# Patient Record
Sex: Female | Born: 1985 | Race: Asian | Hispanic: No | Marital: Married | Smoking: Never smoker
Health system: Southern US, Community
[De-identification: ages and names within clinical notes are randomized; demographics above are authoritative.]

## PROBLEM LIST (undated history)

## (undated) DIAGNOSIS — J302 Other seasonal allergic rhinitis: Secondary | ICD-10-CM

## (undated) DIAGNOSIS — J309 Allergic rhinitis, unspecified: Secondary | ICD-10-CM

## (undated) HISTORY — DX: Allergic rhinitis, unspecified: J30.9

## (undated) HISTORY — PX: WISDOM TOOTH EXTRACTION: SHX21

---

## 2015-06-20 ENCOUNTER — Ambulatory Visit
Admission: EM | Admit: 2015-06-20 | Discharge: 2015-06-20 | Disposition: A | Discharge status: Home / Self Care | Payer: BLUE CROSS/BLUE SHIELD | Attending: Family Medicine | Admitting: Family Medicine

## 2015-06-20 DIAGNOSIS — R05 Cough: Secondary | ICD-10-CM

## 2015-06-20 DIAGNOSIS — J0410 Acute tracheitis without obstruction: Secondary | ICD-10-CM | POA: Diagnosis not present

## 2015-06-20 DIAGNOSIS — R059 Cough, unspecified: Secondary | ICD-10-CM

## 2015-06-20 MED ORDER — FEXOFENADINE-PSEUDOEPHED ER 180-240 MG PO TB24: 1.0000 | ORAL_TABLET | Freq: Every day | ORAL | Status: DC

## 2015-06-20 MED ORDER — AZITHROMYCIN 250 MG PO TABS: ORAL_TABLET | ORAL | Status: AC

## 2015-06-20 MED ORDER — BENZONATATE 200 MG PO CAPS: 200.0000 mg | ORAL_CAPSULE | Freq: Three times a day (TID) | ORAL | Status: DC | PRN

## 2015-06-20 NOTE — Discharge Instructions (Signed)
Cough, Adult °A cough helps to clear your throat and lungs. A cough may last only 2-3 weeks (acute), or it may last longer than 8 weeks (chronic). Many different things can cause a cough. A cough may be a sign of an illness or another medical condition. °HOME CARE °· Pay attention to any changes in your cough. °· Take medicines only as told by your doctor. °· If you were prescribed an antibiotic medicine, take it as told by your doctor. Do not stop taking it even if you start to feel better. °· Talk with your doctor before you try using a cough medicine. °· Drink enough fluid to keep your pee (urine) clear or pale yellow. °· If the air is dry, use a cold steam vaporizer or humidifier in your home. °· Stay away from things that make you cough at work or at home. °· If your cough is worse at night, try using extra pillows to raise your head up higher while you sleep. °· Do not smoke, and try not to be around smoke. If you need help quitting, ask your doctor. °· Do not have caffeine. °· Do not drink alcohol. °· Rest as needed. °GET HELP IF: °· You have new problems (symptoms). °· You cough up yellow fluid (pus). °· Your cough does not get better after 2-3 weeks, or your cough gets worse. °· Medicine does not help your cough and you are not sleeping well. °· You have pain that gets worse or pain that is not helped with medicine. °· You have a fever. °· You are losing weight and you do not know why. °· You have night sweats. °GET HELP RIGHT AWAY IF: °· You cough up blood. °· You have trouble breathing. °· Your heartbeat is very fast. °  °This information is not intended to replace advice given to you by your health care provider. Make sure you discuss any questions you have with your health care provider. °  °Document Released: 01/24/2011 Document Revised: 02/01/2015 Document Reviewed: 07/20/2014 °Elsevier Interactive Patient Education ©2016 [employer] ° °Laryngitis °Laryngitis is swelling (inflammation) of your vocal  cords. This causes hoarseness, coughing, loss of voice, sore throat, or a dry throat. When your vocal cords are inflamed, your voice sounds different. °Laryngitis can be temporary (acute) or long-term (chronic). Most cases of acute laryngitis improve with time. Chronic laryngitis is laryngitis that lasts for more than three weeks. °HOME CARE °· Drink enough fluid to keep your pee (urine) clear or pale yellow. °· Breathe in moist air. Use a humidifier if you live in a dry climate. °· Take medicines only as told by your doctor. °· Do not smoke cigarettes or electronic cigarettes. If you need help quitting, ask your doctor. °· Talk as little as possible. Also avoid whispering, which can cause vocal strain. °· Write instead of talking. Do this until your voice is back to normal. °GET HELP IF: °· You have a fever. °· Your pain is worse. °· You have trouble swallowing. °GET HELP RIGHT AWAY IF: °· You cough up blood. °· You have trouble breathing. °  °This information is not intended to replace advice given to you by your health care provider. Make sure you discuss any questions you have with your health care provider. °  °Document Released: 05/02/2011 Document Revised: 06/03/2014 Document Reviewed: 10/26/2013 °Elsevier Interactive Patient Education ©2016 Elsevier Inc. ° °

## 2015-06-20 NOTE — ED Notes (Signed)
States has had a cough x 1 week and an "itchy throat". Also requests Rx for BCP

## 2015-06-20 NOTE — ED Provider Notes (Signed)
CSN: [id number]     Arrival date & time 06/20/15  0818 History   First MD Initiated Contact with Patient 06/20/15 0840    Nurses notes were reviewed. Chief Complaint  Patient presents with  . Cough    patient is here because of cough she reports having a cough now for about a month earlier and then it went away now the cough is persistent issue over throat started back up again. She states it is tumor throat going on for about a week she reports taking some over-the-counter medication but no symptoms other than the cough and itchiness of her throat. When asked if she had a sore throat she denies asked having a sore throat now.     (Consider location/radiation/quality/duration/timing/severity/associated sxs/prior Treatment) Patient is a 30 y.o. female presenting with cough. The history is provided by the patient. No language interpreter was used.  Cough Cough characteristics:  Non-productive Severity:  Moderate Duration:  1 week Timing:  Constant Progression:  Unable to specify Chronicity:  New Smoker: yes   Context: upper respiratory infection   Context: not animal exposure, not exposure to allergens, not fumes, not occupational exposure, not sick contacts and not smoke exposure   Relieved by:  Nothing Worsened by:  Nothing tried Ineffective treatments:  Cough suppressants Associated symptoms: sore throat   Associated symptoms: no chest pain, no chills, no diaphoresis, no ear fullness, no ear pain, no fever, no headaches, no myalgias, no rash, no rhinorrhea, no shortness of breath, no sinus congestion, no weight loss and no wheezing     History reviewed. No pertinent past medical history. History reviewed. No pertinent past surgical history. History reviewed. No pertinent family history. Social History  Substance Use Topics  . Smoking status: Never Smoker   . Smokeless tobacco: None  . Alcohol Use: No   OB History    No data available     Review of Systems  Constitutional:  Negative for fever, chills, weight loss and diaphoresis.  HENT: Positive for sore throat. Negative for ear pain and rhinorrhea.   Respiratory: Positive for cough. Negative for shortness of breath and wheezing.   Cardiovascular: Negative for chest pain.  Musculoskeletal: Negative for myalgias.  Skin: Negative for rash.  Neurological: Negative for headaches.  All other systems reviewed and are negative.   Allergies  Review of patient's allergies indicates no known allergies.  Home Medications   Prior to Admission medications   Medication Sig Start Date End Date Taking? Authorizing Provider  azithromycin (ZITHROMAX Z-PAK) 250 MG tablet Take 2 tablets first day and then 1 po a day for 4 days. Patient may take between 06/23/2015- 07/10/2014 06/23/15 07/14/15  Hassan Rowan, MD  benzonatate (TESSALON) 200 MG capsule Take 1 capsule (200 mg total) by mouth 3 (three) times daily as needed for cough. 06/20/15   Hassan Rowan, MD  fexofenadine-pseudoephedrine (ALLEGRA-D ALLERGY & CONGESTION) 180-240 MG 24 hr tablet Take 1 tablet by mouth daily. 06/20/15   Hassan Rowan, MD   Meds Ordered and Administered this Visit  Medications - No data to display  BP 116/66 mmHg  Pulse 70  Temp(Src) 97.3 F (36.3 C) (Tympanic)  Resp 16  Ht  (1.6 m)  Wt 105 lb (47.628 kg)  BMI 18.60 kg/m2  SpO2 100%  LMP 06/10/2015 (Exact Date) No data found.   Physical Exam  Constitutional: She is oriented to person, place, and time. She appears well-developed and well-nourished.  HENT:  Head: Normocephalic and atraumatic.  Right Ear:  External ear normal.  Left Ear: External ear normal.  Mouth/Throat: Oropharynx is clear and moist.  Eyes: Conjunctivae are normal. Pupils are equal, round, and reactive to light.  Neck: Normal range of motion. Neck supple. No tracheal deviation present.  Cardiovascular: Normal rate, regular rhythm and normal heart sounds.   Pulmonary/Chest: Effort normal and breath sounds normal.   Musculoskeletal: Normal range of motion.  Neurological: She is alert and oriented to person, place, and time.  Skin: Skin is warm and dry. No erythema.  Psychiatric: She has a normal mood and affect.  Vitals reviewed. pProcedures (including critical care time)  Should be noted the patient is in no distress and not actively coughing   Labs Review Labs Reviewed - No data to display  Imaging Review No results found.   Visual Acuity Review  Right Eye Distance:   Left Eye Distance:   Bilateral Distance:    Right Eye Near:   Left Eye Near:    Bilateral Near:         MDM   1. Tracheitis   2. Cough   For patient on Tessalon Perles for cough and Allegra-D for any congestion and allow to get a Z-Pak on Friday if not better Z-Pak be postdated. After patient left turns out that she had requested the nurse but getting birth control pills the nurse director back to her PCP or the health department was think is appropriate.   Hassan Rowan, MD 06/20/15 [phone]

## 2015-07-24 LAB — OB RESULTS CONSOLE RPR: RPR: NONREACTIVE

## 2015-07-24 LAB — OB RESULTS CONSOLE ANTIBODY SCREEN: Antibody Screen: NEGATIVE

## 2015-07-24 LAB — OB RESULTS CONSOLE ABO/RH: RH Type: POSITIVE

## 2015-07-24 LAB — OB RESULTS CONSOLE HIV ANTIBODY (ROUTINE TESTING): HIV: NONREACTIVE

## 2015-07-24 LAB — OB RESULTS CONSOLE GC/CHLAMYDIA
CHLAMYDIA, DNA PROBE: NEGATIVE
GC PROBE AMP, GENITAL: NEGATIVE

## 2015-07-24 LAB — OB RESULTS CONSOLE HEPATITIS B SURFACE ANTIGEN: Hepatitis B Surface Ag: NEGATIVE

## 2015-07-24 LAB — OB RESULTS CONSOLE RUBELLA ANTIBODY, IGM: RUBELLA: IMMUNE

## 2016-02-02 ENCOUNTER — Other Ambulatory Visit: Payer: Self-pay | Admitting: Obstetrics and Gynecology

## 2016-02-14 LAB — OB RESULTS CONSOLE GBS: STREP GROUP B AG: NEGATIVE

## 2016-02-26 ENCOUNTER — Telehealth (HOSPITAL_COMMUNITY): Payer: Self-pay | Admitting: *Deleted

## 2016-02-26 ENCOUNTER — Encounter (HOSPITAL_COMMUNITY): Payer: Self-pay | Admitting: *Deleted

## 2016-02-26 NOTE — Telephone Encounter (Signed)
Preadmission screen  

## 2016-02-26 NOTE — Pre-Procedure Instructions (Signed)
Interpreter number [phone]

## 2016-02-27 ENCOUNTER — Telehealth (HOSPITAL_COMMUNITY): Payer: Self-pay | Admitting: *Deleted

## 2016-02-27 ENCOUNTER — Encounter (HOSPITAL_COMMUNITY): Payer: Self-pay

## 2016-02-27 NOTE — Pre-Procedure Instructions (Signed)
[phone] interpreter number

## 2016-02-27 NOTE — Telephone Encounter (Signed)
Preadmission screen  

## 2016-02-29 NOTE — Patient Instructions (Signed)
20 Juluis Rainieroyen Lorence  02/29/2016   Your procedure is scheduled on:  03/04/2016  [facility] at 1200 PM.  Pick up the phone at the desk and dial [phone].   Call this number if you have problems the morning of surgery: [phone]   Remember:   Do not eat food:After Midnight.  Do not drink clear liquids: 4 Hours before arrival.  Take these medicines the morning of surgery with A SIP OF WATER: none   Do not wear jewelry, make-up or nail polish.  Do not wear lotions, powders, or perfumes. Do not wear deodorant.  Do not shave 48 hours prior to surgery.  Do not bring valuables to the hospital.  [facility] is not   responsible for any belongings or valuables brought to the hospital.  Contacts, dentures or bridgework may not be worn into surgery.  Leave suitcase in the car. After surgery it may be brought to your room.  For patients admitted to the hospital, checkout time is 11:00 AM the day of              discharge.   Patients discharged the day of surgery will not be allowed to drive             home.  Name and phone number of your driver: na  Special Instructions:   N/A   Please read over the following fact sheets that you were given:   Surgical Site Infection Prevention

## 2016-03-01 ENCOUNTER — Encounter (HOSPITAL_COMMUNITY)
Admission: RE | Admit: 2016-03-01 | Discharge: 2016-03-01 | Disposition: A | Discharge status: Home / Self Care | Payer: BLUE CROSS/BLUE SHIELD | Source: Ambulatory Visit | Attending: Obstetrics and Gynecology | Admitting: Obstetrics and Gynecology

## 2016-03-01 LAB — TYPE AND SCREEN
ABO/RH(D): B POS
ANTIBODY SCREEN: NEGATIVE

## 2016-03-01 LAB — CBC
HEMATOCRIT: 39.4 % (ref 36.0–46.0)
Hemoglobin: 13.6 g/dL (ref 12.0–15.0)
MCH: 28.5 pg (ref 26.0–34.0)
MCHC: 34.5 g/dL (ref 30.0–36.0)
MCV: 82.4 fL (ref 78.0–100.0)
Platelets: 212 10*3/uL (ref 150–400)
RBC: 4.78 MIL/uL (ref 3.87–5.11)
RDW: 14 % (ref 11.5–15.5)
WBC: 9.2 10*3/uL (ref 4.0–10.5)

## 2016-03-01 LAB — ABO/RH: ABO/RH(D): B POS

## 2016-03-02 LAB — RPR: RPR: NONREACTIVE

## 2016-03-04 ENCOUNTER — Inpatient Hospital Stay (HOSPITAL_COMMUNITY): Payer: BLUE CROSS/BLUE SHIELD | Admitting: Anesthesiology

## 2016-03-04 ENCOUNTER — Inpatient Hospital Stay (HOSPITAL_COMMUNITY)
Admission: RE | Admit: 2016-03-04 | Discharge: 2016-03-06 | DRG: 766 | Disposition: A | Discharge status: Home / Self Care | Payer: BLUE CROSS/BLUE SHIELD | Source: Ambulatory Visit | Attending: Obstetrics and Gynecology | Admitting: Obstetrics and Gynecology

## 2016-03-04 ENCOUNTER — Encounter (HOSPITAL_COMMUNITY): Payer: Self-pay | Admitting: *Deleted

## 2016-03-04 ENCOUNTER — Encounter (HOSPITAL_COMMUNITY)
Admission: RE | Disposition: A | Discharge status: Home / Self Care | Payer: Self-pay | Source: Ambulatory Visit | Attending: Obstetrics and Gynecology

## 2016-03-04 DIAGNOSIS — Z3A39 39 weeks gestation of pregnancy: Secondary | ICD-10-CM

## 2016-03-04 DIAGNOSIS — O34211 Maternal care for low transverse scar from previous cesarean delivery: Principal | ICD-10-CM | POA: Diagnosis present

## 2016-03-04 DIAGNOSIS — O26893 Other specified pregnancy related conditions, third trimester: Secondary | ICD-10-CM | POA: Diagnosis present

## 2016-03-04 DIAGNOSIS — R1900 Intra-abdominal and pelvic swelling, mass and lump, unspecified site: Secondary | ICD-10-CM | POA: Diagnosis present

## 2016-03-04 HISTORY — DX: Other seasonal allergic rhinitis: J30.2

## 2016-03-04 SURGERY — Surgical Case
Anesthesia: Spinal | Site: Abdomen | Wound class: Clean Contaminated

## 2016-03-04 MED ORDER — MORPHINE SULFATE-NACL 0.5-0.9 MG/ML-% IV SOSY
PREFILLED_SYRINGE | INTRAVENOUS | Status: AC
  Filled 2016-03-04: qty 1

## 2016-03-04 MED ORDER — ZOLPIDEM TARTRATE 5 MG PO TABS: 5.0000 mg | ORAL_TABLET | Freq: Every evening | ORAL | Status: DC | PRN

## 2016-03-04 MED ORDER — FENTANYL CITRATE (PF) 100 MCG/2ML IJ SOLN
INTRAMUSCULAR | Status: DC | PRN
  Administered 2016-03-04: 10 ug via INTRAVENOUS

## 2016-03-04 MED ORDER — ONDANSETRON HCL 4 MG/2ML IJ SOLN: 4.0000 mg | Freq: Once | INTRAMUSCULAR | Status: DC | PRN

## 2016-03-04 MED ORDER — DIPHENHYDRAMINE HCL 50 MG/ML IJ SOLN: 12.5000 mg | INTRAMUSCULAR | Status: DC | PRN

## 2016-03-04 MED ORDER — FENTANYL CITRATE (PF) 100 MCG/2ML IJ SOLN: 25.0000 ug | INTRAMUSCULAR | Status: DC | PRN

## 2016-03-04 MED ORDER — FENTANYL CITRATE (PF) 100 MCG/2ML IJ SOLN
INTRAMUSCULAR | Status: AC
  Filled 2016-03-04: qty 2

## 2016-03-04 MED ORDER — SIMETHICONE 80 MG PO CHEW
80.0000 mg | CHEWABLE_TABLET | Freq: Three times a day (TID) | ORAL | Status: DC
  Administered 2016-03-05 – 2016-03-06 (×3): 80 mg via ORAL
  Filled 2016-03-04 (×3): qty 1

## 2016-03-04 MED ORDER — BUPIVACAINE IN DEXTROSE 0.75-8.25 % IT SOLN
INTRATHECAL | Status: DC | PRN
  Administered 2016-03-04: 1.6 mL via INTRATHECAL

## 2016-03-04 MED ORDER — OXYCODONE-ACETAMINOPHEN 5-325 MG PO TABS: 1.0000 | ORAL_TABLET | ORAL | Status: DC | PRN

## 2016-03-04 MED ORDER — ONDANSETRON HCL 4 MG/2ML IJ SOLN
INTRAMUSCULAR | Status: AC
  Filled 2016-03-04: qty 2

## 2016-03-04 MED ORDER — SCOPOLAMINE 1 MG/3DAYS TD PT72
1.0000 | MEDICATED_PATCH | Freq: Once | TRANSDERMAL | Status: DC
  Administered 2016-03-04: 1.5 mg via TRANSDERMAL

## 2016-03-04 MED ORDER — PRENATAL MULTIVITAMIN CH
1.0000 | ORAL_TABLET | Freq: Every day | ORAL | Status: DC
  Administered 2016-03-05 – 2016-03-06 (×2): 1 via ORAL
  Filled 2016-03-04 (×2): qty 1

## 2016-03-04 MED ORDER — SENNOSIDES-DOCUSATE SODIUM 8.6-50 MG PO TABS
2.0000 | ORAL_TABLET | ORAL | Status: DC
  Administered 2016-03-04 – 2016-03-05 (×2): 2 via ORAL
  Filled 2016-03-04 (×2): qty 2

## 2016-03-04 MED ORDER — MENTHOL 3 MG MT LOZG: 1.0000 | LOZENGE | OROMUCOSAL | Status: DC | PRN

## 2016-03-04 MED ORDER — DIPHENHYDRAMINE HCL 25 MG PO CAPS: 25.0000 mg | ORAL_CAPSULE | Freq: Four times a day (QID) | ORAL | Status: DC | PRN

## 2016-03-04 MED ORDER — LACTATED RINGERS IV SOLN
INTRAVENOUS | Status: DC
  Administered 2016-03-04 (×2): via INTRAVENOUS

## 2016-03-04 MED ORDER — NALBUPHINE HCL 10 MG/ML IJ SOLN: 5.0000 mg | INTRAMUSCULAR | Status: DC | PRN

## 2016-03-04 MED ORDER — NALBUPHINE HCL 10 MG/ML IJ SOLN: 5.0000 mg | Freq: Once | INTRAMUSCULAR | Status: DC | PRN

## 2016-03-04 MED ORDER — BUPIVACAINE HCL (PF) 0.25 % IJ SOLN
INTRAMUSCULAR | Status: AC
  Filled 2016-03-04: qty 30

## 2016-03-04 MED ORDER — OXYTOCIN 10 UNIT/ML IJ SOLN
INTRAVENOUS | Status: DC | PRN
  Administered 2016-03-04: 40 [IU] via INTRAVENOUS

## 2016-03-04 MED ORDER — OXYTOCIN 10 UNIT/ML IJ SOLN
INTRAMUSCULAR | Status: AC
  Filled 2016-03-04: qty 4

## 2016-03-04 MED ORDER — SCOPOLAMINE 1 MG/3DAYS TD PT72: 1.0000 | MEDICATED_PATCH | Freq: Once | TRANSDERMAL | Status: DC

## 2016-03-04 MED ORDER — KETOROLAC TROMETHAMINE 30 MG/ML IJ SOLN
INTRAMUSCULAR | Status: AC
  Filled 2016-03-04: qty 1

## 2016-03-04 MED ORDER — SIMETHICONE 80 MG PO CHEW: 80.0000 mg | CHEWABLE_TABLET | ORAL | Status: DC | PRN

## 2016-03-04 MED ORDER — ONDANSETRON HCL 4 MG/2ML IJ SOLN: 4.0000 mg | Freq: Three times a day (TID) | INTRAMUSCULAR | Status: DC | PRN

## 2016-03-04 MED ORDER — MORPHINE SULFATE (PF) 0.5 MG/ML IJ SOLN
INTRAMUSCULAR | Status: DC | PRN
  Administered 2016-03-04: .2 mg via EPIDURAL

## 2016-03-04 MED ORDER — COCONUT OIL OIL: 1.0000 "application " | TOPICAL_OIL | Status: DC | PRN

## 2016-03-04 MED ORDER — SODIUM CHLORIDE 0.9% FLUSH: 3.0000 mL | INTRAVENOUS | Status: DC | PRN

## 2016-03-04 MED ORDER — PHENYLEPHRINE 8 MG IN D5W 100 ML (0.08MG/ML) PREMIX OPTIME
INJECTION | INTRAVENOUS | Status: AC
Start: 2016-03-04 — End: 2016-03-04
  Filled 2016-03-04: qty 100

## 2016-03-04 MED ORDER — IBUPROFEN 600 MG PO TABS
600.0000 mg | ORAL_TABLET | Freq: Four times a day (QID) | ORAL | Status: DC
  Administered 2016-03-04 – 2016-03-06 (×7): 600 mg via ORAL
  Filled 2016-03-04 (×7): qty 1

## 2016-03-04 MED ORDER — DIBUCAINE 1 % RE OINT: 1.0000 "application " | TOPICAL_OINTMENT | RECTAL | Status: DC | PRN

## 2016-03-04 MED ORDER — BUPIVACAINE HCL (PF) 0.25 % IJ SOLN
INTRAMUSCULAR | Status: DC | PRN
  Administered 2016-03-04: 10 mL

## 2016-03-04 MED ORDER — SIMETHICONE 80 MG PO CHEW
80.0000 mg | CHEWABLE_TABLET | ORAL | Status: DC
  Administered 2016-03-04 – 2016-03-05 (×2): 80 mg via ORAL
  Filled 2016-03-04 (×2): qty 1

## 2016-03-04 MED ORDER — OXYCODONE-ACETAMINOPHEN 5-325 MG PO TABS: 2.0000 | ORAL_TABLET | ORAL | Status: DC | PRN

## 2016-03-04 MED ORDER — KETOROLAC TROMETHAMINE 30 MG/ML IJ SOLN
30.0000 mg | Freq: Four times a day (QID) | INTRAMUSCULAR | Status: AC | PRN
  Administered 2016-03-04: 30 mg via INTRAMUSCULAR

## 2016-03-04 MED ORDER — LACTATED RINGERS IV SOLN
INTRAVENOUS | Status: DC | PRN
  Administered 2016-03-04: 14:00:00 via INTRAVENOUS

## 2016-03-04 MED ORDER — ONDANSETRON HCL 4 MG/2ML IJ SOLN
INTRAMUSCULAR | Status: DC | PRN
  Administered 2016-03-04: 4 mg via INTRAVENOUS

## 2016-03-04 MED ORDER — WITCH HAZEL-GLYCERIN EX PADS: 1.0000 "application " | MEDICATED_PAD | CUTANEOUS | Status: DC | PRN

## 2016-03-04 MED ORDER — NALOXONE HCL 0.4 MG/ML IJ SOLN: 0.4000 mg | INTRAMUSCULAR | Status: DC | PRN

## 2016-03-04 MED ORDER — MEPERIDINE HCL 25 MG/ML IJ SOLN
6.2500 mg | INTRAMUSCULAR | Status: DC | PRN
Start: 2016-03-04 — End: 2016-03-04

## 2016-03-04 MED ORDER — OXYTOCIN 40 UNITS IN LACTATED RINGERS INFUSION - SIMPLE MED: 2.5000 [IU]/h | INTRAVENOUS | Status: AC

## 2016-03-04 MED ORDER — LACTATED RINGERS IV SOLN
INTRAVENOUS | Status: DC
  Administered 2016-03-04: 23:00:00 via INTRAVENOUS

## 2016-03-04 MED ORDER — CEFAZOLIN SODIUM-DEXTROSE 2-4 GM/100ML-% IV SOLN
2.0000 g | INTRAVENOUS | Status: AC
  Administered 2016-03-04: 2 g via INTRAVENOUS

## 2016-03-04 MED ORDER — SODIUM CHLORIDE 0.9 % IR SOLN
Status: DC | PRN
  Administered 2016-03-04: 1000 mL
  Administered 2016-03-04: 1

## 2016-03-04 MED ORDER — SCOPOLAMINE 1 MG/3DAYS TD PT72
MEDICATED_PATCH | TRANSDERMAL | Status: AC
  Filled 2016-03-04: qty 1

## 2016-03-04 MED ORDER — PHENYLEPHRINE 8 MG IN D5W 100 ML (0.08MG/ML) PREMIX OPTIME
INJECTION | INTRAVENOUS | Status: DC | PRN
  Administered 2016-03-04: 60 ug/min via INTRAVENOUS

## 2016-03-04 MED ORDER — LACTATED RINGERS IV SOLN
INTRAVENOUS | Status: DC
  Administered 2016-03-04: 12:00:00 via INTRAVENOUS

## 2016-03-04 MED ORDER — DIPHENHYDRAMINE HCL 25 MG PO CAPS
25.0000 mg | ORAL_CAPSULE | ORAL | Status: DC | PRN
  Filled 2016-03-04: qty 1

## 2016-03-04 MED ORDER — KETOROLAC TROMETHAMINE 30 MG/ML IJ SOLN: 30.0000 mg | Freq: Four times a day (QID) | INTRAMUSCULAR | Status: AC | PRN

## 2016-03-04 MED ORDER — DEXTROSE 5 % IV SOLN
1.0000 ug/kg/h | INTRAVENOUS | Status: DC | PRN
  Filled 2016-03-04: qty 2

## 2016-03-04 SURGICAL SUPPLY — 43 items
BARRIER ADHS 3X4 INTERCEED (GAUZE/BANDAGES/DRESSINGS) ×2 IMPLANT
BENZOIN TINCTURE PRP APPL 2/3 (GAUZE/BANDAGES/DRESSINGS) IMPLANT
CHLORAPREP W/TINT 26ML (MISCELLANEOUS) ×2 IMPLANT
CLAMP CORD UMBIL (MISCELLANEOUS) IMPLANT
CLOTH BEACON ORANGE TIMEOUT ST (SAFETY) ×2 IMPLANT
CONTAINER PREFILL 10% NBF 15ML (MISCELLANEOUS) IMPLANT
DRAPE C SECTION CLR SCREEN (DRAPES) ×2 IMPLANT
DRSG OPSITE POSTOP 4X10 (GAUZE/BANDAGES/DRESSINGS) ×2 IMPLANT
ELECT REM PT RETURN 9FT ADLT (ELECTROSURGICAL) ×2
ELECTRODE REM PT RTRN 9FT ADLT (ELECTROSURGICAL) ×1 IMPLANT
EXTRACTOR VACUUM M CUP 4 TUBE (SUCTIONS) IMPLANT
GLOVE BIOGEL PI IND STRL 7.0 (GLOVE) ×2 IMPLANT
GLOVE BIOGEL PI INDICATOR 7.0 (GLOVE) ×2
GLOVE ECLIPSE 6.5 STRL STRAW (GLOVE) ×2 IMPLANT
GOWN STRL REUS W/TWL LRG LVL3 (GOWN DISPOSABLE) ×4 IMPLANT
KIT ABG SYR 3ML LUER SLIP (SYRINGE) IMPLANT
NEEDLE HYPO 22GX1.5 SAFETY (NEEDLE) ×2 IMPLANT
NEEDLE HYPO 25X5/8 SAFETYGLIDE (NEEDLE) IMPLANT
NS IRRIG 1000ML POUR BTL (IV SOLUTION) ×2 IMPLANT
PACK C SECTION WH (CUSTOM PROCEDURE TRAY) ×2 IMPLANT
PAD OB MATERNITY 4.3X12.25 (PERSONAL CARE ITEMS) ×2 IMPLANT
RTRCTR C-SECT PINK 25CM LRG (MISCELLANEOUS) IMPLANT
SPONGE LAP 18X18 X RAY DECT (DISPOSABLE) ×2 IMPLANT
STRIP CLOSURE SKIN 1/2X4 (GAUZE/BANDAGES/DRESSINGS) IMPLANT
SUT CHROMIC GUT AB #0 18 (SUTURE) IMPLANT
SUT MNCRL 0 VIOLET CTX 36 (SUTURE) ×3 IMPLANT
SUT MON AB 2-0 SH 27 (SUTURE)
SUT MON AB 2-0 SH27 (SUTURE) IMPLANT
SUT MON AB 3-0 SH 27 (SUTURE)
SUT MON AB 3-0 SH27 (SUTURE) IMPLANT
SUT MON AB 4-0 PS1 27 (SUTURE) IMPLANT
SUT MONOCRYL 0 CTX 36 (SUTURE) ×3
SUT PLAIN 2 0 (SUTURE)
SUT PLAIN 2 0 XLH (SUTURE) IMPLANT
SUT PLAIN ABS 2-0 CT1 27XMFL (SUTURE) IMPLANT
SUT VIC AB 0 CT1 36 (SUTURE) ×4 IMPLANT
SUT VIC AB 2-0 CT1 27 (SUTURE) ×2
SUT VIC AB 2-0 CT1 TAPERPNT 27 (SUTURE) ×2 IMPLANT
SUT VIC AB 4-0 KS 27 (SUTURE) ×2 IMPLANT
SUT VIC AB 4-0 PS2 27 (SUTURE) IMPLANT
SYR CONTROL 10ML LL (SYRINGE) ×2 IMPLANT
TOWEL OR 17X24 6PK STRL BLUE (TOWEL DISPOSABLE) ×2 IMPLANT
TRAY FOLEY CATH SILVER 14FR (SET/KITS/TRAYS/PACK) IMPLANT

## 2016-03-04 NOTE — Anesthesia Procedure Notes (Signed)
Spinal  Patient location during procedure: OR Staffing Anesthesiologist: Lavoris Sparling Performed: anesthesiologist  Preanesthetic Checklist Completed: patient identified, site marked, surgical consent, pre-op evaluation, timeout performed, IV checked, risks and benefits discussed and monitors and equipment checked Spinal Block Patient position: sitting Prep: DuraPrep Patient monitoring: continuous pulse ox, blood pressure and heart rate Approach: midline Location: L3-4 Injection technique: single-shot Needle Needle type: Spinocan  Needle gauge: 24 G Needle length: 9 cm Additional Notes Functioning IV was confirmed and monitors were applied. Sterile prep and drape, including hand hygiene, mask and sterile gloves were used. The patient was positioned and the spine was prepped. The skin was anesthetized with lidocaine.  Free flow of clear CSF was obtained prior to injecting local anesthetic into the CSF.  The spinal needle aspirated freely following injection.  The needle was carefully withdrawn.  The patient tolerated the procedure well. Consent was obtained prior to procedure with all questions answered and concerns addressed. Risks including but not limited to bleeding, infection, nerve damage, paralysis, failed block, inadequate analgesia, allergic reaction, high spinal, itching and headache were discussed and the patient wished to proceed.   Brayen Bunn, MD      

## 2016-03-04 NOTE — Consult Note (Signed)
Neonatology Note:   Attendance at C-section:    I was asked by Dr. Cherly Hensenousins to attend this repeat C/S at term. The mother is GBS negative without prenatal issues reported. ROM 0 hours before delivery, fluid clear. Infant vigorous with good spontaneous cry and tone. Needed only minimal bulb suctioning. Ap 8/9. Lungs clear to ausc in DR. To CN to care of Pediatrician.  Dineen Kidavid C. Leary RocaEhrmann, MD

## 2016-03-04 NOTE — Transfer of Care (Signed)
Immediate Anesthesia Transfer of Care Note  Patient: Erica Cole  Procedure(s) Performed: Procedure(s) with comments: Repeat CESAREAN SECTION/Excision of Right Incisional Mass - EDD: 03/11/16 MD needs RNFA  Patient Location: PACU  Anesthesia Type:Spinal  Level of Consciousness: awake, alert  and oriented  Airway & Oxygen Therapy: Patient Spontanous Breathing  Post-op Assessment: Report given to RN and Post -op Vital signs reviewed and stable  Post vital signs: Reviewed and stable  Last Vitals:  Vitals:   03/04/16 1155  BP: 130/89  Pulse: 86  Resp: 18  Temp: 37 C    Last Pain:  Vitals:   03/04/16 1155  TempSrc: Oral      Patients Stated Pain Goal: 3 (03/04/16 1155)  Complications: No apparent anesthesia complications

## 2016-03-04 NOTE — Anesthesia Preprocedure Evaluation (Signed)
Anesthesia Evaluation  Patient identified by MRN, date of birth, ID band Patient awake    Reviewed: Allergy & Precautions, NPO status , Patient's Chart, lab work & pertinent test results  History of Anesthesia Complications Negative for: history of anesthetic complications  Airway Mallampati: II  TM Distance: >3 FB Neck ROM: Full    Dental no notable dental hx. (+) Dental Advisory Given   Pulmonary neg pulmonary ROS,    Pulmonary exam normal breath sounds clear to auscultation       Cardiovascular negative cardio ROS Normal cardiovascular exam Rhythm:Regular Rate:Normal     Neuro/Psych negative neurological ROS  negative psych ROS   GI/Hepatic negative GI ROS, Neg liver ROS,   Endo/Other  negative endocrine ROS  Renal/GU negative Renal ROS  negative genitourinary   Musculoskeletal negative musculoskeletal ROS (+)   Abdominal   Peds negative pediatric ROS (+)  Hematology negative hematology ROS (+)   Anesthesia Other Findings   Reproductive/Obstetrics (+) Pregnancy                             Anesthesia Physical Anesthesia Plan  ASA: II  Anesthesia Plan: Spinal   Post-op Pain Management:    Induction:   Airway Management Planned:   Additional Equipment:   Intra-op Plan:   Post-operative Plan:   Informed Consent: I have reviewed the patients History and Physical, chart, labs and discussed the procedure including the risks, benefits and alternatives for the proposed anesthesia with the patient or authorized representative who has indicated his/her understanding and acceptance.   Dental advisory given  Plan Discussed with: CRNA  Anesthesia Plan Comments:         Anesthesia Quick Evaluation  

## 2016-03-04 NOTE — Brief Op Note (Signed)
03/04/2016  3:00 PM  PATIENT:  Erica Cole  30 y.o. female  PRE-OPERATIVE DIAGNOSIS:  Previous Cesarean Section, Right abdominal wall/incisional Mass, term gestation  POST-OPERATIVE DIAGNOSIS:  Previous Cesarean Section, Right Incisional/abodminal wall Mass, term gestation  PROCEDURE:  Repeat Cesarean section, kerr hysterotomy Excision of right abdominal wall mass  SURGEON:  Surgeon(s) and Role:    * Maxie BetterSheronette Naseem Varden, MD - Primary  PHYSICIAN ASSISTANT:   ASSISTANTS: Metallurgistheather kreitemeyer, RNFA   ANESTHESIA:   spinal Findings:  live female LOT, nuchal x 2. Nl tubes and ovaries Apgar 8/9, right ant wall above and slight lateral to incision, firm oblong mass EBL:  Total I/O In: 3000 [I.V.:3000] Out: 900 [Urine:200; Blood:700]  BLOOD ADMINISTERED:none  DRAINS: none   LOCAL MEDICATIONS USED:  MARCAINE     SPECIMEN:  Source of Specimen:  abdominal wall mass  DISPOSITION OF SPECIMEN:  PATHOLOGY  COUNTS:  YES  TOURNIQUET:  * No tourniquets in log *  DICTATION: .Other Dictation: Dictation Number [id number]  PLAN OF CARE: Admit to inpatient   PATIENT DISPOSITION:  PACU - hemodynamically stable.   Delay start of Pharmacological VTE agent (>24hrs) due to surgical blood loss or risk of bleeding: no

## 2016-03-04 NOTE — Anesthesia Postprocedure Evaluation (Signed)
Anesthesia Post Note  Patient: Erica Cole  Procedure(s) Performed: Procedure(s): Repeat CESAREAN SECTION/Excision of Right Incisional Mass  Patient location during evaluation: PACU Anesthesia Type: Spinal Level of consciousness: oriented and awake and alert Pain management: pain level controlled Vital Signs Assessment: post-procedure vital signs reviewed and stable Respiratory status: spontaneous breathing, respiratory function stable and patient connected to nasal cannula oxygen Cardiovascular status: blood pressure returned to baseline and stable Postop Assessment: no headache, no backache, spinal receding and patient able to bend at knees Anesthetic complications: no     Last Vitals:  Vitals:   03/04/16 1155 03/04/16 1524  BP: 130/89   Pulse: 86   Resp: 18   Temp: 37 C 36.8 C    Last Pain:  Vitals:   03/04/16 1615  TempSrc:   PainSc: 0-No pain   Pain Goal: Patients Stated Pain Goal: 3 (03/04/16 1155)               Ramello Cordial JENNETTE

## 2016-03-05 ENCOUNTER — Encounter (HOSPITAL_COMMUNITY): Payer: Self-pay | Admitting: Obstetrics and Gynecology

## 2016-03-05 LAB — CBC
HCT: 33.3 % — ABNORMAL LOW (ref 36.0–46.0)
Hemoglobin: 11.4 g/dL — ABNORMAL LOW (ref 12.0–15.0)
MCH: 28.4 pg (ref 26.0–34.0)
MCHC: 34.2 g/dL (ref 30.0–36.0)
MCV: 82.8 fL (ref 78.0–100.0)
Platelets: 170 10*3/uL (ref 150–400)
RBC: 4.02 MIL/uL (ref 3.87–5.11)
RDW: 14.2 % (ref 11.5–15.5)
WBC: 12.7 10*3/uL — ABNORMAL HIGH (ref 4.0–10.5)

## 2016-03-05 NOTE — Op Note (Deleted)
  The note originally documented on this encounter has been moved the the encounter in which it belongs.  

## 2016-03-05 NOTE — Progress Notes (Signed)
Patient ID: Erica Cole, female   DOB: [birth year], 30 y.o.   MRN: [id number] Subjective: Repeat Cesarean Delivery / Excision of Abdominal Wall Mass POD# 1 Information for the patient's newborn:  Erica Cole, Boy Erica Cole [[id number]][[id number]]  female  / circ done  Reports feeling well. Feeding: breast Patient reports tolerating PO.  Breast symptoms: none Pain controlled with ibuprofen (OTC) Denies HA/SOB/C/P/N/V/dizziness. Flatus absent. No BM. She reports vaginal bleeding as normal, without clots.  She is ambulating, urinating without difficult.     Objective:   VS:  Vitals:   03/04/16 1939 03/04/16 2045 03/05/16 0030 03/05/16 0415  BP: 135/75 138/72  121/66  Pulse: 78 79  77  Resp: 18 18 18 18   Temp: 99.1 F (37.3 C) 99 F (37.2 C) 98.3 F (36.8 C) 98.9 F (37.2 C)  TempSrc: Oral Oral Oral Oral  SpO2: 98% 97% 99% 96%     Intake/Output Summary (Last 24 hours) at 03/05/16 0846 Last data filed at 03/05/16 0415  Gross per 24 hour  Intake             3300 ml  Output             2325 ml  Net              975 ml        Recent Labs  03/05/16 0532  WBC 12.7*  HGB 11.4*  HCT 33.3*  PLT 170     Blood type: B POS (10/06 1110)  Rubella: Immune (02/27 0000)     Physical Exam:   General: alert, cooperative and no distress  CV: Regular rate and rhythm, S1S2 present or without murmur or extra heart sounds  Resp: clear  Abdomen: soft, nontender, normal bowel sounds  Incision: dry, intact and serous drainage present skin well-approximated with sutures  Uterine Fundus: firm, 1 FB below umbilicus, nontender  Lochia: minimal  Ext: edema Trace and Homans sign is negative, no sign of DVT   Assessment/Plan: 30 y.o.   POD# 1.  S/P Cesarean Delivery.  Indications: Repeat Cesarean Delivery / Excision of Abdominal Wall Mass                Principal Problem:   Postpartum care following cesarean delivery (10/9)  Doing well, stable.               Regular diet as tolerated D/C IV per unit  protocol Ambulate Routine post-op care  Kenard GowerAWSON, Willeen Novak, M, MSN, CNM 03/05/2016, 8:46 AM

## 2016-03-05 NOTE — Addendum Note (Signed)
Addendum  created 03/05/16 0805 by Renford DillsJanet L Emoni Whitworth, CRNA   Sign clinical note

## 2016-03-05 NOTE — Op Note (Signed)
NAME:  Erica Cole, Erica Cole                   ACCOUNT NO.:  [id number]  MEDICAL RECORD NO.:  [id number]  LOCATION:                                FACILITY:  [facility]  PHYSICIAN:  Maxie BetterSheronette Kian Gamarra, M.D.DATE OF BIRTH:  [birth year]  DATE OF PROCEDURE:  03/04/2016 DATE OF DISCHARGE:                              OPERATIVE REPORT   PREOPERATIVE DIAGNOSES:  Previous cesarean section, term gestation, abdominal wall mass.  PROCEDURES:  Repeat cesarean section, Kerr hysterotomy, excision of abdominal wall mass.  POSTOPERATIVE DIAGNOSES:  Previous cesarean section, term gestation, abdominal wall mass.  ANESTHESIA:  Spinal.  ASSISTANT:  Webb SilversmithHeather Krietemeyer, RNFA.  DESCRIPTION OF PROCEDURE:  Under adequate spinal anesthesia, the patient was placed in the supine position with a left lateral tilt.  She was sterilely prepped and draped in usual fashion.  An indwelling Foley catheter was sterilely placed.  0.25% Marcaine was injected along the previous Pfannenstiel skin incision site.  Pfannenstiel skin incision was then made, carried down to the rectus fascia.  The rectus fascia was opened transversely.  The rectus fascia was then bluntly and sharply dissected off the rectus muscle in superior and inferior fashion.  The parietal peritoneum was opened sharply and incision was extended superiorly and inferiorly.  A self-retaining Alexis retractor was then placed.  A well-developed lower uterine segment was noted.  The vesicouterine peritoneum was opened transversely.  The bladder was dissected off the lower uterine segment and displaced inferiorly with blunt dissection.  Curvilinear low-transverse uterine incision was then made and extended with bandage scissors.  Artificial rupture of membranes occurred.  Clear copious amniotic fluid was noted.  Vertex presentation was noted.  Attempt at delivering of the head was unsuccessful resulting the need to apply the vacuum, which was then placed.  A left occiput  transverse position was noted as the baby was being delivered.  Cord around the neck x2 noted was reducible.  Baby was bulb suctioned in the abdomen subsequently after delayed of one minute. The cord was then clamped, cut.  The baby was transferred to the awaiting pediatricians who assigned Apgars of 8 and 9 at 1 and 5 minutes.  The placenta which was anterior was removed intact, not sent to Pathology.  Uterine cavity was cleaned of debris.  Uterine incision had no extension.  It was closed in 2 layers, the first layer 0 Monocryl running lock stitch.  Second layer was imbricated using 0 Monocryl suture.  Normal tubes and ovaries were noted bilaterally.  The abdomen was then irrigated and suctioned of debris.  Interceed was placed over the lower uterine segment in an inverted T fashion.  The Alexis retractor was then removed.  Attention was then turned to the abdominal wall mass, which through the full thickness of the wall was noted to be about 1 cm above the right lateral aspect of the incision and in the lateral fashion was about 2.5 cm long about 1 cm wide.  Palpation through the incision was accomplished, then the subcutaneous space between the rectus fascia overlying the palpable mass was then opened and sharp dissection was then performed along with cautery.  At that point, there was a  firm area that was then seen and continued dissection was then accomplished with lifting the mass off the fascia while keeping the fascia intact.  Once this area was freed up, the specimen was removed.  Small bleeders cauterized.  Interrupted 2-0 plain sutures was placed in that area to approximate the defect in the abdominal wall and then the parietal peritoneum was then closed with 2-0 Vicryl.  The rectus fascia was closed with O-vicryl x2.  The subcutaneous area was irrigated.  Small bleeders cauterized.  Interrupted 2-0 plain sutures placed and the skin with 4-0 Vicryl Keith subcuticular closure.   Specimen was abdominal wall mass sent to Pathology.  ESTIMATED BLOOD LOSS:  700 mL.  INTRAOPERATIVE FLUIDS:  2200 mL.  URINE OUTPUT:  200 mL.  SPONGE AND INSTRUMENT COUNT:  Counts x2 was correct.  COMPLICATION:  None.  The patient was transferred to recovery room in stable condition. Baby was  Placed skin to skin.     Maxie Better, M.D.   ______________________________ Maxie Better, M.D.    /MEDQ  D:  03/04/2016  T:  03/05/2016  Job:  [id number]

## 2016-03-05 NOTE — Anesthesia Postprocedure Evaluation (Signed)
Anesthesia Post Note  Patient: Erica Cole  Procedure(s) Performed: Procedure(s): Repeat CESAREAN SECTION/Excision of Right Incisional Mass  Patient location during evaluation: Mother Baby Anesthesia Type: Spinal Level of consciousness: awake Pain management: pain level controlled Vital Signs Assessment: post-procedure vital signs reviewed and stable Respiratory status: spontaneous breathing Cardiovascular status: stable Postop Assessment: no headache, no backache, spinal receding, patient able to bend at knees, no signs of nausea or vomiting and adequate PO intake Anesthetic complications: no     Last Vitals:  Vitals:   03/05/16 0030 03/05/16 0415  BP:  121/66  Pulse:  77  Resp: 18 18  Temp: 36.8 C 37.2 C    Last Pain:  Vitals:   03/05/16 0600  TempSrc:   PainSc: 0-No pain   Pain Goal: Patients Stated Pain Goal: 3 (03/04/16 1155)               Arline Ketter

## 2016-03-06 MED ORDER — IBUPROFEN 600 MG PO TABS: 600.0000 mg | ORAL_TABLET | Freq: Four times a day (QID) | ORAL | 0 refills | Status: DC

## 2016-03-06 MED ORDER — OXYCODONE-ACETAMINOPHEN 5-325 MG PO TABS: 1.0000 | ORAL_TABLET | ORAL | 0 refills | Status: DC | PRN

## 2016-03-06 NOTE — Discharge Summary (Signed)
OB Discharge Summary  Patient Name: Erica Cole DOB: 31-Mar-1986 MRN: [id number]  Date of admission: 03/04/2016  Admitting diagnosis: previous cesarean section, right abdominal wall mass Intrauterine pregnancy: [id number]      Date of discharge: 03/06/2016    Discharge diagnosis: Term gestation delivered. Previous cesarean section. Right abdominal wall mass   Prenatal history: G2P1002   EDC : 03/11/2016, by Other Basis  [facility]  Primary provider : Puanani Gene Prenatal course complicated by previous CS  Prenatal Labs: ABO, Rh: --/--/B POS, B POS (10/06 1110)  Antibody: NEG (10/06 1110) Rubella: Immune (02/27 0000)   RPR: Non Reactive (10/06 1110)  HBsAg: Negative (02/27 0000)  HIV: Non-reactive (02/27 0000)  GBS: Negative (09/20 0000)                                    Hospital course:  Sceduled C/S   30 y.o. yo G2P1002 at [redacted]w[redacted]d was admitted to the hospital 03/04/2016 for scheduled cesarean section with the following indication:Elective Repeat.  Membrane Rupture Time/Date: 2:06 PM ,03/04/2016   Patient delivered a Viable infant.03/04/2016  Details of operation can be found in separate operative note.  Pateint had an uncomplicated postpartum course.  She is ambulating, tolerating a regular diet, passing flatus, and urinating well. Patient is discharged home in stable condition on  03/06/16        Delivering PROVIDER: Jaima Janney                                                            Complications: None  Newborn Data: Live born female  Birth Weight: 8 lb 6.4 oz (3810 g) APGAR: 8, 9  Baby Feeding: Breast Disposition:home with mother  Post partum procedures:none  Postpartum contraception: Not Discussed    Labs: Lab Results  Component Value Date   WBC 12.7 (H) 03/05/2016   HGB 11.4 (L) 03/05/2016   HCT 33.3 (L) 03/05/2016   MCV 82.8 03/05/2016   PLT 170 03/05/2016   No flowsheet data found.  Physical Exam @ time of  discharge:  Vitals:   03/05/16 0415 03/05/16 0920 03/05/16 1745 03/06/16 0507  BP: 121/66 113/60 111/60 123/63  Pulse: 77 64 77 71  Resp: 18 16 18 18   Temp: 98.9 F (37.2 C) 98.5 F (36.9 C) 98.3 F (36.8 C) 97.8 F (36.6 C)  TempSrc: Oral Oral Oral Oral  SpO2: 96%       General: alert, cooperative and no distress Lochia: appropriate Uterine Fundus: firm Perineum: intact Incision: Healing well with no significant drainage Extremities: DVT Evaluation: No evidence of DVT seen on physical exam.   Discharge instructions:  "Baby and Me Booklet" and [facility] Booklet  Discharge Medications:    Medication List    TAKE these medications   acetaminophen 325 MG tablet Commonly known as:  TYLENOL Take 650 mg by mouth every 6 (six) hours as needed for mild pain or headache.   cetirizine 10 MG tablet Commonly known as:  ZYRTEC Take 10 mg by mouth daily.   ibuprofen 600 MG tablet Commonly known as:  ADVIL,MOTRIN Take 1 tablet (600 mg total) by mouth every 6 (six) hours.   oxyCODONE-acetaminophen  5-325 MG tablet Commonly known as:  PERCOCET/ROXICET Take 1 tablet by mouth every 4 (four) hours as needed (pain scale 4-7).   prenatal multivitamin Tabs tablet Take 1 tablet by mouth daily at 12 noon.       Diet: routine diet  Activity: Advance as tolerated. Pelvic rest x 6 weeks.   Follow up:6 weeks    Signed: Marlinda Mike CNM, MSN, [facility] 03/06/2016, 11:48 AM

## 2016-03-06 NOTE — Progress Notes (Signed)
POSTOPERATIVE DAY # 2 S/P CS   S:         Reports feeling well - requests home today             Tolerating po intake / no nausea / no vomiting / = flatus / + BM             Bleeding is light             Pain controlled with pain meds             Up ad lib / ambulatory/ voiding QS  Newborn breast feeding    O:  VS: BP 123/63 (BP Location: Right Arm)   Pulse 71   Temp 97.8 F (36.6 C) (Oral)   Resp 18   LMP 06/10/2015 (Exact Date)   SpO2 96%   Breastfeeding? Unknown    LABS:               Recent Labs  03/05/16 0532  WBC 12.7*  HGB 11.4*  PLT 170               Bloodtype: --/--/B POS, B POS (10/06 1110)  Rubella: Immune (02/27 0000)                                Physical Exam:             Alert and Oriented X3  Lungs: Clear and unlabored  Heart: regular rate and rhythm / no mumurs  Abdomen: soft, non-tender, non-distended              Fundus: firm, non-tender, U-1             Dressing intact honeycomb              Incision:  approximated with suture / no erythema / no ecchymosis / no drainage  Perineum: intact  Lochia: light  Extremities: trace edema, no calf pain or tenderness,   A:        POD # 2 S/P CS             P:        Routine postoperative care              DC home - WOB instructions / booklet given     Marlinda MikeBAILEY, Raegan Winders CNM, MSN, [facility] 03/06/2016, 11:45 AM

## 2016-10-05 ENCOUNTER — Encounter (HOSPITAL_COMMUNITY): Payer: Self-pay | Admitting: Emergency Medicine

## 2016-10-05 ENCOUNTER — Emergency Department (HOSPITAL_COMMUNITY)
Admission: EM | Admit: 2016-10-05 | Discharge: 2016-10-05 | Disposition: A | Discharge status: Home / Self Care | Payer: BLUE CROSS/BLUE SHIELD | Attending: Emergency Medicine | Admitting: Emergency Medicine

## 2016-10-05 ENCOUNTER — Emergency Department (HOSPITAL_COMMUNITY): Payer: BLUE CROSS/BLUE SHIELD

## 2016-10-05 DIAGNOSIS — Z79899 Other long term (current) drug therapy: Secondary | ICD-10-CM | POA: Diagnosis not present

## 2016-10-05 DIAGNOSIS — Y939 Activity, unspecified: Secondary | ICD-10-CM | POA: Insufficient documentation

## 2016-10-05 DIAGNOSIS — S8001XA Contusion of right knee, initial encounter: Secondary | ICD-10-CM | POA: Diagnosis not present

## 2016-10-05 DIAGNOSIS — Y92410 Unspecified street and highway as the place of occurrence of the external cause: Secondary | ICD-10-CM | POA: Diagnosis not present

## 2016-10-05 DIAGNOSIS — S8002XA Contusion of left knee, initial encounter: Secondary | ICD-10-CM

## 2016-10-05 DIAGNOSIS — S60222A Contusion of left hand, initial encounter: Secondary | ICD-10-CM | POA: Diagnosis not present

## 2016-10-05 DIAGNOSIS — Y999 Unspecified external cause status: Secondary | ICD-10-CM | POA: Insufficient documentation

## 2016-10-05 DIAGNOSIS — S20219A Contusion of unspecified front wall of thorax, initial encounter: Secondary | ICD-10-CM | POA: Insufficient documentation

## 2016-10-05 LAB — I-STAT CHEM 8, ED
BUN: 12 mg/dL (ref 6–20)
CALCIUM ION: 1.16 mmol/L (ref 1.15–1.40)
CREATININE: 0.7 mg/dL (ref 0.44–1.00)
Chloride: 104 mmol/L (ref 101–111)
GLUCOSE: 90 mg/dL (ref 65–99)
HEMATOCRIT: 42 % (ref 36.0–46.0)
Hemoglobin: 14.3 g/dL (ref 12.0–15.0)
Potassium: 3.5 mmol/L (ref 3.5–5.1)
SODIUM: 140 mmol/L (ref 135–145)
TCO2: 26 mmol/L (ref 0–100)

## 2016-10-05 LAB — I-STAT BETA HCG BLOOD, ED (MC, WL, AP ONLY): I-stat hCG, quantitative: <5 m[IU]/mL (ref <5)

## 2016-10-05 MED ORDER — IBUPROFEN 600 MG PO TABS: 600.0000 mg | ORAL_TABLET | Freq: Four times a day (QID) | ORAL | 0 refills | Status: DC | PRN

## 2016-10-05 MED ORDER — IBUPROFEN 400 MG PO TABS
600.0000 mg | ORAL_TABLET | Freq: Once | ORAL | Status: AC
  Administered 2016-10-05: 600 mg via ORAL
  Filled 2016-10-05: qty 1

## 2016-10-05 MED ORDER — METHOCARBAMOL 500 MG PO TABS
500.0000 mg | ORAL_TABLET | Freq: Once | ORAL | Status: AC
  Administered 2016-10-05: 500 mg via ORAL
  Filled 2016-10-05: qty 1

## 2016-10-05 MED ORDER — METHOCARBAMOL 500 MG PO TABS: 500.0000 mg | ORAL_TABLET | Freq: Every evening | ORAL | 0 refills | Status: DC | PRN

## 2016-10-05 NOTE — ED Notes (Signed)
Patient transported to X-ray 
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
## 2016-10-05 NOTE — Discharge Instructions (Signed)
Take Ibuprofen three times daily for the next week. Take this medicine with food. °Take muscle relaxer at bedtime to help you sleep. This medicine makes you drowsy so do not take before driving or work °Use a heating pad for sore muscles - use for 20 minutes several times a day °Return for worsening symptoms ° °

## 2016-10-29 ENCOUNTER — Ambulatory Visit
Admission: RE | Admit: 2016-10-29 | Discharge: 2016-10-29 | Disposition: A | Discharge status: Home / Self Care | Payer: BLUE CROSS/BLUE SHIELD | Source: Ambulatory Visit | Attending: Physician Assistant | Admitting: Physician Assistant

## 2016-10-29 ENCOUNTER — Other Ambulatory Visit: Payer: Self-pay | Admitting: Physician Assistant

## 2016-10-29 DIAGNOSIS — R079 Chest pain, unspecified: Secondary | ICD-10-CM

## 2016-10-29 DIAGNOSIS — R0781 Pleurodynia: Secondary | ICD-10-CM

## 2016-11-01 ENCOUNTER — Ambulatory Visit (INDEPENDENT_AMBULATORY_CARE_PROVIDER_SITE_OTHER): Payer: BLUE CROSS/BLUE SHIELD | Admitting: Family Medicine

## 2016-11-01 ENCOUNTER — Encounter: Payer: Self-pay | Admitting: Family Medicine

## 2016-11-01 VITALS — BP 100/70 | HR 93 | Temp 98.0°F | Resp 12 | Ht 63.0 in | Wt 122.3 lb

## 2016-11-01 DIAGNOSIS — K611 Rectal abscess: Secondary | ICD-10-CM

## 2016-11-01 DIAGNOSIS — J309 Allergic rhinitis, unspecified: Secondary | ICD-10-CM | POA: Diagnosis not present

## 2016-11-01 DIAGNOSIS — S20212D Contusion of left front wall of thorax, subsequent encounter: Secondary | ICD-10-CM

## 2016-11-01 DIAGNOSIS — R0789 Other chest pain: Secondary | ICD-10-CM

## 2016-11-01 HISTORY — DX: Allergic rhinitis, unspecified: J30.9

## 2016-11-01 MED ORDER — MONTELUKAST SODIUM 10 MG PO TABS: 10.0000 mg | ORAL_TABLET | Freq: Every day | ORAL | 3 refills | Status: DC

## 2016-11-01 MED ORDER — FLUTICASONE PROPIONATE 50 MCG/ACT NA SUSP: 1.0000 | Freq: Two times a day (BID) | NASAL | 3 refills | Status: AC

## 2016-11-01 MED ORDER — AMOXICILLIN-POT CLAVULANATE 875-125 MG PO TABS: 1.0000 | ORAL_TABLET | Freq: Two times a day (BID) | ORAL | 0 refills | Status: AC
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
## 2016-11-01 NOTE — Patient Instructions (Signed)
A few things to remember from today's visit:   Chest wall pain  Perirectal abscess - Plan: Ambulatory referral to General Surgery, amoxicillin-clavulanate (AUGMENTIN) 875-125 MG tablet  Allergic rhinitis, unspecified seasonality, unspecified trigger - Plan: montelukast (SINGULAIR) 10 MG tablet, fluticasone (FLONASE) 50 MCG/ACT nasal spray  There are 2 forms of allergic rhinitis: . Seasonal (hay fever): Caused by an allergy to pollen and/or mold spores in the air. Pollen is the fine powder that comes from the stamen of flowering plants. It can be carried through the air and is easily inhaled. Symptoms are seasonal and usually occur in spring, late summer, and fall. Marland Kitchen. Perennial: Caused by other allergens such as dust mites, pet hair or dander, or mold. Symptoms occur year-round.  Symptoms: Your symptoms can vary, depending on the severity of your allergies. Symptoms can include: Sneezing, coughing.itching (mostly eyes, nose, mouth, throat and skin),runny nose,stuffy nose.headache,pressure in the nose and cheeks,ear fullness and popping, sore throat.watery, red, or swollen eyes,dark circles under your eyes,trouble smelling, and sometimes hives.  Allergic rhinitis cannot be prevented. You can help your symptoms by avoiding the things that you are allergic, including: . Keeping windows closed. This is especially important during high-pollen seasons. . Washing your hands after petting animals. . Using dust- and mite-proof bedding and mattress covers. . Wearing glasses outside to protect your eyes. . Showering before bed to wash off allergens from hair and skin. You can also avoid things that can make your symptoms worse, such as: . aerosol sprays . air pollution . cold temperatures . humidity . irritating fumes . tobacco smoke . wind . wood smoke.   Antihistamines help reduce the sneezing, runny nose, and itchiness of allergies. These come in pill form and as nasal sprays.  Allegra,Zyrtec,or Claritin are some examples. Decongestants, such as pseudoephedrine and phenylephrine, help temporarily relieve the stuffy nose of allergies. Decongestants are found in many medicines and come as pills, nose sprays, and nose drops. They could increase heart rate and cause tachycardia and tremor. Nasal Afrin should not be used for more than 3 days because you can become dependent on them. This causes you to feel even more stopped-up when you try to quit using them.  Nasal sprays: steroids or antihistaminics. Over the counter intranasal sterids: Nasocort,Rhinocort,or Flonase.You won't notice their benefits for up to 2 weeks after starting them. Allergy shots or sublingual tablets when other treatment do not help.This is done by immunologists.   AVOID SHALLOW BREATHING.  Please be sure medication list is accurate. If a new problem present, please set up appointment sooner than planned today.

## 2016-11-03 ENCOUNTER — Encounter: Payer: Self-pay | Admitting: Family Medicine

## 2017-10-14 ENCOUNTER — Ambulatory Visit: Payer: Self-pay | Admitting: Surgery

## 2017-10-29 ENCOUNTER — Ambulatory Visit: Payer: BLUE CROSS/BLUE SHIELD | Admitting: Surgery

## 2017-10-29 ENCOUNTER — Other Ambulatory Visit: Payer: Self-pay

## 2017-10-29 ENCOUNTER — Encounter: Payer: Self-pay | Admitting: Surgery

## 2017-10-29 VITALS — BP 112/71 | HR 64 | Temp 98.3°F | Ht 63.0 in | Wt 110.0 lb

## 2017-10-29 DIAGNOSIS — K602 Anal fissure, unspecified: Secondary | ICD-10-CM | POA: Diagnosis not present

## 2017-10-29 NOTE — Progress Notes (Signed)
Surgical Consultation  10/29/2017  Erica Cole is an 32 y.o. female.   Chief Complaint  Patient presents with  . New Patient (Initial Visit)    Anal fistula     HPI: 32 year old female seen in consultation at the request of Dr. [place] for questionable anal fistula.  She reports that after she had her last baby about a year and a half ago started noticing again some intermittent drainage from the anorectal area, intermittent sharp pains that were mild to moderate intensity and worsening when she was having a bowel movement. Currently she had a similar episode several years ago that was treated medically. She denies any constipation or diarrhea.  No previous anorectal procedures.  She did have a C-section a year and a half ago.    She states that she has some intermittent yellow drainage but has improved significantly over the last few days. She is able to perform more than 4 METS of activity without any shortness of breath or chest pain. No hematochezia.  Past Medical History:  Diagnosis Date  . Allergic rhinitis 11/01/2016  . Postpartum care following cesarean delivery (10/9) 03/04/2016  . Seasonal allergies     Past Surgical History:  Procedure Laterality Date  . CESAREAN SECTION  2007  . CESAREAN SECTION  03/04/2016   Procedure: Repeat CESAREAN SECTION/Excision of Right Incisional Mass;  Surgeon: Maxie BetterSheronette Cousins, MD;  [facility];  Service: Obstetrics;;  EDD: 03/11/16 MD needs RNFA  . WISDOM TOOTH EXTRACTION      Family History  Problem Relation Age of Onset  . Cancer Neg Hx   . Diabetes Neg Hx   . Heart disease Neg Hx   . Hyperlipidemia Neg Hx   . Hypertension Neg Hx   . Stroke Neg Hx     Social History:  reports that she has never smoked. She has never used smokeless tobacco. She reports that she does not drink alcohol or use drugs.  Allergies:  Allergies  Allergen Reactions  . Other Other (See Comments)    Fruit makes throat itch     Medications reviewed.     ROS Full ROS performed and is otherwise negative other than what is stated in the HPI    BP 112/71   Pulse 64   Temp 98.3 F (36.8 C) (Oral)   Ht 5\' 3"  (1.6 m)   Wt 49.9 kg (110 lb)   BMI 19.49 kg/m   Physical Exam  Constitutional: She is oriented to person, place, and time. She appears well-developed and well-nourished.  Neck: No JVD present. No tracheal deviation present.  Pulmonary/Chest: Effort normal. No respiratory distress.  Abdominal: Soft. She exhibits no distension and no mass. There is no tenderness. There is no rebound and no guarding.  Genitourinary:  Genitourinary Comments: There is some thickening on the posterior midline.  There is significant tenderness to palpation on posterior midline and significant increase in his sphincter tone.  Exam is limited due to discomfort.  On inspection I cannot definitively see a fistula nor fissure.  She does have sentinel tach on the anterior aspect.  No rectal masses.  Musculoskeletal: Normal range of motion. She exhibits no edema or deformity.  Neurological: She is alert and oriented to person, place, and time. She displays normal reflexes. No cranial nerve deficit or sensory deficit. She exhibits normal muscle tone. Coordination normal.  Skin: Skin is warm and dry. Capillary refill takes less than 2 seconds.  Psychiatric: She has a normal mood and  affect. Her behavior is normal. Judgment and thought content normal.  Nursing note and vitals reviewed.  Assessment/Plan: 32 year old with classic signs and symptoms consistent with anal fissure.  She reports some signs of a potential fistula but on exam I cannot definitively confirm this. Given that the signs and symptoms point more towards a fissure I will start treating as such.  We will start twice daily sitz baths, high-fiber diet including Metamucil and twice daily nifedipine cream.  I will see her back in about 4 weeks to reevaluate.  Patient may  need an exam under anesthesia/buttocks depending on clinical circumstances. Intensive counseling provided to the patient. A copy of this report will be sent to the referring provider  Sterling Big, MD [facility] General Surgeon

## 2017-10-29 NOTE — Patient Instructions (Addendum)
Please do sitz baths twice daily and after each bowel movement.  Please pick up the medicine at the pharmacy below. And apply twice daily.  Please also take Metamucil daily to help with softening your stool.   [facility]  [address] [place], [place] [id number]   Please see your follow up appointment listed below.

## 2017-11-19 ENCOUNTER — Ambulatory Visit (INDEPENDENT_AMBULATORY_CARE_PROVIDER_SITE_OTHER): Payer: BLUE CROSS/BLUE SHIELD | Admitting: Surgery

## 2017-11-19 ENCOUNTER — Encounter: Payer: Self-pay | Admitting: Surgery

## 2017-11-19 VITALS — BP 116/75 | HR 66 | Temp 98.1°F | Ht 63.0 in | Wt 112.0 lb

## 2017-11-19 DIAGNOSIS — K610 Anal abscess: Secondary | ICD-10-CM

## 2017-11-19 MED ORDER — AMOXICILLIN-POT CLAVULANATE 875-125 MG PO TABS: 1.0000 | ORAL_TABLET | Freq: Two times a day (BID) | ORAL | 0 refills | Status: AC

## 2017-11-19 NOTE — Progress Notes (Signed)
Outpatient Surgical Follow Up  11/19/2017  Erica Cole is an 32 y.o. female.   Chief Complaint  Patient presents with  . Follow-up    Anal fissure    HPI: Erica Cole going up for anorectal discomfort.  She now states that there is a small infection on the posterior aspect.  There is tender to palpation.  No fevers no chills.  No hematochezia.  Past Medical History:  Diagnosis Date  . Allergic rhinitis 11/01/2016  . Postpartum care following cesarean delivery (10/9) 03/04/2016  . Seasonal allergies     Past Surgical History:  Procedure Laterality Date  . CESAREAN SECTION  2007  . CESAREAN SECTION  03/04/2016   Procedure: Repeat CESAREAN SECTION/Excision of Right Incisional Mass;  Surgeon: Maxie BetterSheronette Cousins, MD;  [facility];  Service: Obstetrics;;  EDD: 03/11/16 MD needs RNFA  . WISDOM TOOTH EXTRACTION      Family History  Problem Relation Age of Onset  . Cancer Neg Hx   . Diabetes Neg Hx   . Heart disease Neg Hx   . Hyperlipidemia Neg Hx   . Hypertension Neg Hx   . Stroke Neg Hx     Social History:  reports that she has never smoked. She has never used smokeless tobacco. She reports that she does not drink alcohol or use drugs.  Allergies:  Allergies  Allergen Reactions  . Other Other (See Comments)    Fruit makes throat itch    Medications reviewed.    ROS Full ROS performed and is otherwise negative other than what is stated in HPI   BP 116/75   Pulse 66   Temp 98.1 F (36.7 C) (Oral)   Ht 5\' 3"  (1.6 m)   Wt 50.8 kg (112 lb)   BMI 19.84 kg/m   Physical Exam  Constitutional: She is oriented to person, place, and time. She appears well-developed and well-nourished. No distress.  Eyes: EOM are normal. Right eye exhibits no discharge. Left eye exhibits no discharge. No scleral icterus.  Abdominal: Soft. She exhibits no distension. There is no tenderness. There is no guarding.  Genitourinary:  Genitourinary Comments: There is a posterior  midline carbuncle measures 8 mm size, tender to palpation. No obvious fistula  Neurological: She is alert and oriented to person, place, and time. Coordination normal.  Skin: Skin is warm and dry. Capillary refill takes less than 2 seconds.  Psychiatric: She has a normal mood and affect. Her behavior is normal. Judgment and thought content normal.  Nursing note and vitals reviewed.       Assessment/Plan: Small perianal abscess / carbuncle. D/W the pt in detail about options. Given its small size I think it is reasonable to treat it with antibiotics. I will see her back in 2 weeks and if at that time sxs persist may need EUA. She understands.   Greater than 50% of the 15 minutes  visit was spent in counseling/coordination of care   Erica Bigiego Znya Albino, MD [facility] General Surgeon

## 2017-11-19 NOTE — Patient Instructions (Signed)
Please pick up your medicine at the pharmacy.  Please see your follow up appointment listed below. 

## 2017-12-03 ENCOUNTER — Ambulatory Visit: Payer: Self-pay | Admitting: Surgery
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
